# Patient Record
Sex: Male | Born: 1980 | Race: White | Hispanic: No | Marital: Married | Smoking: Former smoker
Health system: Southern US, Community
[De-identification: ages and names within clinical notes are randomized; demographics above are authoritative.]

---

## 2007-04-28 ENCOUNTER — Emergency Department (HOSPITAL_COMMUNITY): Admission: EM | Admit: 2007-04-28 | Discharge: 2007-04-28 | Payer: Self-pay | Admitting: Family Medicine

## 2011-09-03 ENCOUNTER — Emergency Department (HOSPITAL_BASED_OUTPATIENT_CLINIC_OR_DEPARTMENT_OTHER)
Admission: EM | Admit: 2011-09-03 | Discharge: 2011-09-03 | Disposition: A | Discharge status: Home / Self Care | Payer: Self-pay | Attending: Emergency Medicine | Admitting: Emergency Medicine

## 2011-09-03 ENCOUNTER — Encounter (HOSPITAL_BASED_OUTPATIENT_CLINIC_OR_DEPARTMENT_OTHER): Payer: Self-pay | Admitting: *Deleted

## 2011-09-03 ENCOUNTER — Emergency Department (HOSPITAL_BASED_OUTPATIENT_CLINIC_OR_DEPARTMENT_OTHER): Payer: Self-pay

## 2011-09-03 DIAGNOSIS — R269 Unspecified abnormalities of gait and mobility: Secondary | ICD-10-CM | POA: Insufficient documentation

## 2011-09-03 DIAGNOSIS — M25579 Pain in unspecified ankle and joints of unspecified foot: Secondary | ICD-10-CM | POA: Insufficient documentation

## 2011-09-03 DIAGNOSIS — S93402A Sprain of unspecified ligament of left ankle, initial encounter: Secondary | ICD-10-CM

## 2011-09-03 DIAGNOSIS — W11XXXA Fall on and from ladder, initial encounter: Secondary | ICD-10-CM | POA: Insufficient documentation

## 2011-09-03 DIAGNOSIS — F17200 Nicotine dependence, unspecified, uncomplicated: Secondary | ICD-10-CM | POA: Insufficient documentation

## 2011-09-03 DIAGNOSIS — S93409A Sprain of unspecified ligament of unspecified ankle, initial encounter: Secondary | ICD-10-CM | POA: Insufficient documentation

## 2011-09-03 NOTE — ED Provider Notes (Signed)
History     CSN: [id number]  Arrival date & time 09/03/11  1802   First MD Initiated Contact with Patient 09/03/11 1820      Chief Complaint  Patient presents with  . Ankle Pain    (Consider location/radiation/quality/duration/timing/severity/associated sxs/prior treatment) HPI 31 yo male with complaint of ankle pain after fall from ladder this morning.  States that he was stepping down off ladder when he rolled his ankle and fell.  Fall was ~3 feet, did not strike his head. Fall occurred around 10am.  Denies pain elsewhere.  Has noticed bruising and swelling on inside of foot and painful to walk on.   History reviewed. No pertinent past medical history.  History reviewed. No pertinent past surgical history.  History reviewed. No pertinent family history.  History  Substance Use Topics  . Smoking status: Current Everyday Smoker -- 1.0 packs/day  . Smokeless tobacco: Not on file  . Alcohol Use: No      Review of Systems  Musculoskeletal: Positive for gait problem.  Neurological: Negative for dizziness, weakness, numbness and headaches.    Allergies  Review of patient's allergies indicates no known allergies.  Home Medications  No current outpatient prescriptions on file.  BP 129/72  Pulse 88  Temp(Src) 98.6 F (37 C) (Oral)  Resp 16  Ht 5\' 6"  (1.676 m)  Wt 130 lb (58.968 kg)  BMI 20.98 kg/m2  SpO2 100%  Physical Exam  Constitutional: He appears well-nourished. No distress.  Musculoskeletal:       Tenderness to palpation along soft tissue structures of L ankle.    Bruising and swelling present medially. No bony tenderness along medial or lateral malleoli.  Difficulty walking 2/2 to pain.  Sensation intact, able to move toes.     ED Course  Procedures (including critical care time)  Labs Reviewed - No data to display No results found.   No diagnosis found.    MDM  Ankle sprain, negative films of ankle.  Will place in air cast.  Instructions given  for home care.  Patient has crutches at home he will use. 6:51 PM         Everrett Coombe, DO 09/03/11 6606

## 2011-09-03 NOTE — ED Notes (Signed)
Pt c/o left ankle injury after fall off ladder 3 ft

## 2011-09-03 NOTE — Discharge Instructions (Signed)
Use ibuprofen as needed for pain control and swelling.  Ice your ankle 3-4 times per day and keep it elevated over the next couple of days.  You can use work as you can tolerate over the next few days.    Ankle Sprain An ankle sprain is an injury to the strong, fibrous tissues (ligaments) that hold the bones of your ankle joint together.  CAUSES Ankle sprain usually is caused by a fall or by twisting your ankle. People who participate in sports are more prone to these types of injuries.  SYMPTOMS  Symptoms of ankle sprain include:  Pain in your ankle. The pain may be present at rest or only when you are trying to stand or walk.   Swelling.   Bruising. Bruising may develop immediately or within 1 to 2 days after your injury.   Difficulty standing or walking.  DIAGNOSIS  Your caregiver will ask you details about your injury and perform a physical exam of your ankle to determine if you have an ankle sprain. During the physical exam, your caregiver will press and squeeze specific areas of your foot and ankle. Your caregiver will try to move your ankle in certain ways. An X-ray exam may be done to be sure a bone was not broken or a ligament did not separate from one of the bones in your ankle (avulsion).  TREATMENT  Certain types of braces can help stabilize your ankle. Your caregiver can make a recommendation for this. Your caregiver may recommend the use of medication for pain. If your sprain is severe, your caregiver may refer you to a surgeon who helps to restore function to parts of your skeletal system (orthopedist) or a physical therapist. HOME CARE INSTRUCTIONS  Apply ice to your injury for 1 to 2 days or as directed by your caregiver. Applying ice helps to reduce inflammation and pain.  Put ice in a plastic bag.   Place a towel between your skin and the bag.   Leave the ice on for 15 to 20 minutes at a time, every 2 hours while you are awake.   Take over-the-counter or prescription  medicines for pain, discomfort, or fever only as directed by your caregiver.   Keep your injured leg elevated, when possible, to lessen swelling.   If your caregiver recommends crutches, use them as instructed. Gradually, put weight on the affected ankle. Continue to use crutches or a cane until you can walk without feeling pain in your ankle.   If you have a plaster splint, wear the splint as directed by your caregiver. Do not rest it on anything harder than a pillow the first 24 hours. Do not put weight on it. Do not get it wet. You may take it off to take a shower or bath.   You may have been given an elastic bandage to wear around your ankle to provide support. If the elastic bandage is too tight (you have numbness or tingling in your foot or your foot becomes cold and blue), adjust the bandage to make it comfortable.   If you have an air splint, you may blow more air into it or let air out to make it more comfortable. You may take your splint off at night and before taking a shower or bath.   Wiggle your toes in the splint several times per day if you are able.  SEEK MEDICAL CARE IF:   You have an increase in bruising, swelling, or pain.   Your  toes feel cold.   Pain relief is not achieved with medication.  SEEK IMMEDIATE MEDICAL CARE IF: Your toes are numb or blue or you have severe pain. MAKE SURE YOU:   Understand these instructions.   Will watch your condition.   Will get help right away if you are not doing well or get worse.  Document Released: 03/23/2005 Document Revised: 03/12/2011 Document Reviewed: 10/26/2007 [facility] Patient Information 2012 [place], [place].

## 2011-09-03 NOTE — ED Provider Notes (Signed)
I saw and evaluated the patient, reviewed the resident's note and I agree with the findings and plan. Patient with mechanical injury and evidence of ankle sprain with negative films  Gwyneth Sprout, MD 09/03/11 2349

## 2017-07-15 ENCOUNTER — Other Ambulatory Visit: Payer: Self-pay

## 2017-07-15 ENCOUNTER — Emergency Department (HOSPITAL_BASED_OUTPATIENT_CLINIC_OR_DEPARTMENT_OTHER)
Admission: EM | Admit: 2017-07-15 | Discharge: 2017-07-15 | Disposition: A | Discharge status: Home / Self Care | Payer: BLUE CROSS/BLUE SHIELD | Attending: Emergency Medicine | Admitting: Emergency Medicine

## 2017-07-15 ENCOUNTER — Encounter (HOSPITAL_BASED_OUTPATIENT_CLINIC_OR_DEPARTMENT_OTHER): Payer: Self-pay | Admitting: Emergency Medicine

## 2017-07-15 DIAGNOSIS — R109 Unspecified abdominal pain: Secondary | ICD-10-CM | POA: Insufficient documentation

## 2017-07-15 DIAGNOSIS — Z87891 Personal history of nicotine dependence: Secondary | ICD-10-CM | POA: Diagnosis not present

## 2017-07-15 DIAGNOSIS — R3129 Other microscopic hematuria: Secondary | ICD-10-CM | POA: Diagnosis not present

## 2017-07-15 LAB — URINALYSIS, ROUTINE W REFLEX MICROSCOPIC
Bilirubin Urine: NEGATIVE
GLUCOSE, UA: NEGATIVE mg/dL
KETONES UR: NEGATIVE mg/dL
LEUKOCYTES UA: NEGATIVE
Nitrite: NEGATIVE
PROTEIN: NEGATIVE mg/dL
Specific Gravity, Urine: <1.005 — ABNORMAL LOW (ref 1.005–1.030)
pH: 6.5 (ref 5.0–8.0)

## 2017-07-15 LAB — URINALYSIS, MICROSCOPIC (REFLEX)

## 2017-07-15 LAB — BASIC METABOLIC PANEL
ANION GAP: 8 (ref 5–15)
BUN: 14 mg/dL (ref 6–20)
CALCIUM: 9.2 mg/dL (ref 8.9–10.3)
CO2: 24 mmol/L (ref 22–32)
Chloride: 106 mmol/L (ref 101–111)
Creatinine, Ser: 0.74 mg/dL (ref 0.61–1.24)
GFR calc Af Amer: >60 mL/min (ref >60)
GFR calc non Af Amer: >60 mL/min (ref >60)
GLUCOSE: 98 mg/dL (ref 65–99)
Potassium: 4.3 mmol/L (ref 3.5–5.1)
Sodium: 138 mmol/L (ref 135–145)

## 2017-07-15 MED ORDER — NAPROXEN 500 MG PO TABS: 500.0000 mg | ORAL_TABLET | Freq: Two times a day (BID) | ORAL | 0 refills | Status: AC

## 2017-07-15 MED FILL — NAPROXEN 500 MG TABLET: 500 | 10 days supply | Qty: 20 | Fill #0

## 2017-07-15 NOTE — ED Triage Notes (Signed)
Pt sent here by [facility] to r/o kidney stones.   Pt states they stated that he had blood in his urine.

## 2017-07-15 NOTE — Discharge Instructions (Signed)
Please read and follow all provided instructions.  Your diagnoses today include:  1. Flank pain   2. Other microscopic hematuria     Tests performed today include:  Urine test that showed blood in your urine and no infection  Blood test that showed normal kidney function  Vital signs. See below for your results today.   Medications prescribed:   Naproxen - anti-inflammatory pain medication  Do not exceed 500mg  naproxen every 12 hours, take with food  You have been prescribed an anti-inflammatory medication or NSAID. Take with food. Take smallest effective dose for the shortest duration needed for your pain. Stop taking if you experience stomach pain or vomiting.   Take any prescribed medications only as directed.  Home care instructions:  Follow any educational materials contained in this packet.  Please double your fluid intake for the next several days. Strain your urine and save any stones that may pass.   BE VERY CAREFUL not to take multiple medicines containing Tylenol (also called acetaminophen). Doing so can lead to an overdose which can damage your liver and cause liver failure and possibly death.   Follow-up instructions: Please follow-up with your urologist or the urologist referral (provided on front page) in the next 1 week for further evaluation of your symptoms.  Return instructions:   Please return to the Emergency Department if you experience worsening symptoms.  Please return if you develop fever or uncontrolled pain or vomiting.  Please return if you have any other emergent concerns.  Additional Information:  Your vital signs today were: BP 129/83 (BP Location: Left Arm)    Pulse (!) 57    Temp 98 F (36.7 C) (Oral)    Resp 16    Ht 5\' 6"  (1.676 m)    Wt 59 kg (130 lb)    SpO2 100%    BMI 20.98 kg/m  If your blood pressure (BP) was elevated above 135/85 this visit, please have this repeated by your doctor within one month. --------------

## 2017-07-15 NOTE — ED Provider Notes (Signed)
[facility] EMERGENCY DEPARTMENT Provider Note   CSN: [id number] Arrival date & time: 07/15/17  [id number]     History   Chief Complaint Chief Complaint  Patient presents with  . Flank Pain    HPI Nathan Bryan is a 37 y.o. male.  Patient presents the emergency department with acute onset of left-sided flank pain at approximately 4 AM today.  Pain woke patient from sleep.  He states that the pain was severe and he was up in his house pacing.  Pain gradually improved by 7 AM.  It did radiate a bit to the L lower abdomen.  No scrotal or testicular pain. Patient went to an outside urgent care and was found to have hematuria and was sent to the emergency department for evaluation.  Pain is nearly resolved at this time.  No associated fevers, nausea, vomiting, or diarrhea.  No urinary symptoms or hematuria.  Patient has never had a kidney stone before. Aggravating factors: none. Alleviating factors: none.  Patient works outside in the heat.      No past medical history on file.  There are no active problems to display for this patient.   No past surgical history on file.      Home Medications    Prior to Admission medications   Not on File    Family History No family history on file.  Social History Social History   Tobacco Use  . Smoking status: Former Smoker    Packs/day: 1.00  . Smokeless tobacco: Never Used  Substance Use Topics  . Alcohol use: No  . Drug use: No     Allergies   Patient has no known allergies.   Review of Systems Review of Systems  Constitutional: Negative for fever.  HENT: Negative for rhinorrhea and sore throat.   Eyes: Negative for redness.  Respiratory: Negative for cough.   Cardiovascular: Negative for chest pain.  Gastrointestinal: Negative for abdominal pain, diarrhea, nausea and vomiting.  Genitourinary: Positive for flank pain. Negative for dysuria.  Musculoskeletal: Negative for myalgias.  Skin: Negative for rash.   Neurological: Negative for headaches.     Physical Exam Updated Vital Signs BP 129/83 (BP Location: Left Arm)   Pulse (!) 57   Temp 98 F (36.7 C) (Oral)   Resp 16   Ht 5\' 6"  (1.676 m)   Wt 59 kg (130 lb)   SpO2 100%   BMI 20.98 kg/m   Physical Exam  Constitutional: He appears well-developed and well-nourished.  HENT:  Head: Normocephalic and atraumatic.  Eyes: Conjunctivae are normal. Right eye exhibits no discharge. Left eye exhibits no discharge.  Neck: Normal range of motion. Neck supple.  Cardiovascular: Normal rate, regular rhythm and normal heart sounds.  Pulmonary/Chest: Effort normal and breath sounds normal.  Abdominal: Soft. There is no tenderness. There is no rebound and no guarding.  No CVA tenderness.   Neurological: He is alert.  Skin: Skin is warm and dry.  Psychiatric: He has a normal mood and affect.  Nursing note and vitals reviewed.    ED Treatments / Results  Labs (all labs ordered are listed, but only abnormal results are displayed) Labs Reviewed  URINALYSIS, ROUTINE W REFLEX MICROSCOPIC - Abnormal; Notable for the following components:      Result Value   Specific Gravity, Urine <1.005 (*)    Hgb urine dipstick SMALL (*)    All other components within normal limits  URINALYSIS, MICROSCOPIC (REFLEX) - Abnormal; Notable for the following  components:   Bacteria, UA RARE (*)    Squamous Epithelial / LPF 0-5 (*)    All other components within normal limits  BASIC METABOLIC PANEL    EKG None  Radiology No results found.  Procedures Procedures (including critical care time)  Medications Ordered in ED Medications - No data to display   Initial Impression / Assessment and Plan / ED Course  I have reviewed the triage vital signs and the nursing notes.  Pertinent labs & imaging results that were available during my care of the patient were reviewed by me and considered in my medical decision making (see chart for details).      Patient seen and examined. UA with hemoglobin on dipstick. No muscle pain to suspect rhabdomyolysis.  Will ensure kidney function is okay. If pain still controlled, anticipate d/c to home with urine strainer, hydration.    Vital signs reviewed and are as follows: BP 129/83 (BP Location: Left Arm)   Pulse (!) 57   Temp 98 F (36.7 C) (Oral)   Resp 16   Ht 5\' 6"  (1.676 m)   Wt 59 kg (130 lb)   SpO2 100%   BMI 20.98 kg/m   11:53 AM Patient counseled on kidney stone treatment. Urged patient to strain urine and save any stones. Urged urology follow-up and return with any complications. Counseled patient to maintain good fluid intake.    Final Clinical Impressions(s) / ED Diagnoses   Final diagnoses:  Flank pain  Other microscopic hematuria   Patient with symptoms suspicious for left-sided ureteral colic.  Patient had severe symptoms for several hours but then resolved.  He has some mild hematuria.  Normal kidney function.  No UTI.  Patient is currently asymptomatic and has not required any medications here.  Do not feel that patient requires advanced imaging at this time.  Counseled him on what to do of signs and symptoms to return.  ED Discharge Orders        Ordered    naproxen (NAPROSYN) 500 MG tablet  2 times daily     07/15/17 1149       Renne Crigler, PA-C 07/15/17 1154    Arby Barrette, MD 07/16/17 [phone]

## 2017-07-23 ENCOUNTER — Emergency Department (HOSPITAL_BASED_OUTPATIENT_CLINIC_OR_DEPARTMENT_OTHER)
Admission: EM | Admit: 2017-07-23 | Discharge: 2017-07-23 | Disposition: A | Discharge status: Home / Self Care | Payer: BLUE CROSS/BLUE SHIELD | Attending: Emergency Medicine | Admitting: Emergency Medicine

## 2017-07-23 ENCOUNTER — Encounter (HOSPITAL_BASED_OUTPATIENT_CLINIC_OR_DEPARTMENT_OTHER): Payer: Self-pay | Admitting: *Deleted

## 2017-07-23 ENCOUNTER — Emergency Department (HOSPITAL_BASED_OUTPATIENT_CLINIC_OR_DEPARTMENT_OTHER): Payer: BLUE CROSS/BLUE SHIELD

## 2017-07-23 ENCOUNTER — Other Ambulatory Visit: Payer: Self-pay

## 2017-07-23 DIAGNOSIS — Z87891 Personal history of nicotine dependence: Secondary | ICD-10-CM | POA: Insufficient documentation

## 2017-07-23 DIAGNOSIS — M25572 Pain in left ankle and joints of left foot: Secondary | ICD-10-CM | POA: Insufficient documentation

## 2017-07-23 MED ORDER — IBUPROFEN 600 MG PO TABS: 600.0000 mg | ORAL_TABLET | Freq: Four times a day (QID) | ORAL | 0 refills | Status: AC | PRN

## 2017-07-23 NOTE — ED Triage Notes (Signed)
He slid while playing baseball 4 days ago. Bruising and swelling to his ankle and foot.

## 2017-07-23 NOTE — ED Provider Notes (Signed)
[facility] EMERGENCY DEPARTMENT Provider Note   CSN: [id number] Arrival date & time: 07/23/17  1219     History   Chief Complaint Chief Complaint  Patient presents with  . Ankle Injury    HPI Nathan Bryan is a 37 y.o. male who is previously healthy who presents with a 5-day history of left ankle pain after he was playing softball and slid into a base.  He has been walking on his foot all week.  It continues to swell.  He has had some pain, however it is improving.  He has associated bruising.  He has been taking Tylenol at home.  He denies any numbness or tingling.  He denies any other injuries.  Patient reports he is using a brace at home.  HPI  History reviewed. No pertinent past medical history.  There are no active problems to display for this patient.   History reviewed. No pertinent surgical history.      Home Medications    Prior to Admission medications   Medication Sig Start Date End Date Taking? Authorizing Provider  ibuprofen (ADVIL,MOTRIN) 600 MG tablet Take 1 tablet (600 mg total) by mouth every 6 (six) hours as needed. 07/23/17   Derin Matthes, Waylan Boga, PA-C  naproxen (NAPROSYN) 500 MG tablet Take 1 tablet (500 mg total) by mouth 2 (two) times daily. 07/15/17   Renne Crigler, PA-C    Family History No family history on file.  Social History Social History   Tobacco Use  . Smoking status: Former Smoker    Packs/day: 1.00  . Smokeless tobacco: Never Used  Substance Use Topics  . Alcohol use: No  . Drug use: No     Allergies   Patient has no known allergies.   Review of Systems Review of Systems  Musculoskeletal: Positive for arthralgias and joint swelling.  Skin: Positive for color change.  Neurological: Negative for numbness.     Physical Exam Updated Vital Signs BP 123/81   Pulse 77   Temp 98.5 F (36.9 C) (Oral)   Resp 16   Ht 5\' 6"  (1.676 m)   Wt 59 kg (130 lb)   SpO2 100%   BMI 20.98 kg/m   Physical Exam    Constitutional: He appears well-developed and well-nourished. No distress.  HENT:  Head: Normocephalic and atraumatic.  Mouth/Throat: Oropharynx is clear and moist. No oropharyngeal exudate.  Eyes: Pupils are equal, round, and reactive to light. Conjunctivae are normal. Right eye exhibits no discharge. Left eye exhibits no discharge. No scleral icterus.  Neck: Normal range of motion. Neck supple. No thyromegaly present.  Cardiovascular: Normal rate, regular rhythm, normal heart sounds and intact distal pulses. Exam reveals no gallop and no friction rub.  No murmur heard. Pulmonary/Chest: Effort normal and breath sounds normal. No stridor. No respiratory distress. He has no wheezes. He has no rales.  Musculoskeletal: He exhibits no edema.       Left ankle: He exhibits decreased range of motion, swelling and ecchymosis. He exhibits normal pulse. Tenderness. Lateral malleolus tenderness found. No head of 5th metatarsal and no proximal fibula tenderness found. Achilles tendon normal.       Feet:  Patient able to plantar dorsiflex, however inversion eversion is limited, patient states he is unable to evert unrelated to pain  Lymphadenopathy:    He has no cervical adenopathy.  Neurological: He is alert. Coordination normal.  Skin: Skin is warm and dry. No rash noted. He is not diaphoretic. No pallor.  Psychiatric: He has a normal mood and affect.  Nursing note and vitals reviewed.        ED Treatments / Results  Labs (all labs ordered are listed, but only abnormal results are displayed) Labs Reviewed - No data to display  EKG None  Radiology Dg Ankle Complete Left  Result Date: 07/23/2017 CLINICAL DATA:  Slipped while playing baseball a week ago EXAM: LEFT ANKLE COMPLETE - 3+ VIEW COMPARISON:  None. FINDINGS: There is no evidence of fracture, dislocation, or joint effusion. There is no evidence of arthropathy or other focal bone abnormality. There is severe soft tissue swelling  overlying the lateral malleolus. IMPRESSION: No acute osseous injury of the left ankle. Electronically Signed   By: Elige KoHetal  Patel   On: 07/23/2017 12:49   Dg Foot Complete Left  Result Date: 07/23/2017 CLINICAL DATA:  Softball injury.  Bruising. EXAM: LEFT FOOT - COMPLETE 3+ VIEW COMPARISON:  None. FINDINGS: There is no evidence of fracture or dislocation. There is no evidence of arthropathy or other focal bone abnormality. Soft tissues are unremarkable. IMPRESSION: Negative. Electronically Signed   By: Elsie StainJohn T Curnes M.D.   On: 07/23/2017 12:48    Procedures Procedures (including critical care time)  Medications Ordered in ED Medications - No data to display   Initial Impression / Assessment and Plan / ED Course  I have reviewed the triage vital signs and the nursing notes.  Pertinent labs & imaging results that were available during my care of the patient were reviewed by me and considered in my medical decision making (see chart for details).     Patient with suspected severe left ankle sprain.  X-rays of the ankle and foot are negative.  Reviewed by me and radiologist.  Will advise crutches and brace.  Patient reports he has both at home, including lace up ASO brace.  Patient advised to take ibuprofen, elevate, and continue ice.  Will refer to Dr. Pearletha ForgeHudnall with sports medicine.  Return precautions discussed.  Patient understands and agrees with plan.  Patient vitals stable throughout ED course and discharged in satisfactory condition.  Final Clinical Impressions(s) / ED Diagnoses   Final diagnoses:  Acute left ankle pain    ED Discharge Orders        Ordered    ibuprofen (ADVIL,MOTRIN) 600 MG tablet  Every 6 hours PRN     07/23/17 1353       Emi HolesLaw, Maribell Demeo M, PA-C 07/23/17 1359    Tegeler, Canary Brimhristopher J, MD 07/23/17 1546

## 2017-07-23 NOTE — Discharge Instructions (Signed)
Medications: ibuprofen  Treatment: Take ibuprofen every 6 hours.  You can alternate with Tylenol as prescribed over-the-counter.  Use ice 3-4 times daily alternating 20 minutes on, 20 minutes off.  Keep your leg elevated whenever you are not walking on it.  Use crutches at all times and begin partial weightbearing as tolerated.  Follow-up: Please follow-up with Dr. Pearletha ForgeHudnall for further evaluation and treatment of your ankle injury.  Please return to the emergency department if you develop any new or worsening symptoms.

## 2019-10-11 IMAGING — DX DG FOOT COMPLETE 3+V*L*
3 series · 3 of 3 positions shown · non-contrast
Comparison: None.

CLINICAL DATA: Softball injury.  Bruising.

EXAM:
LEFT FOOT - COMPLETE 3+ VIEW

[foot ap]
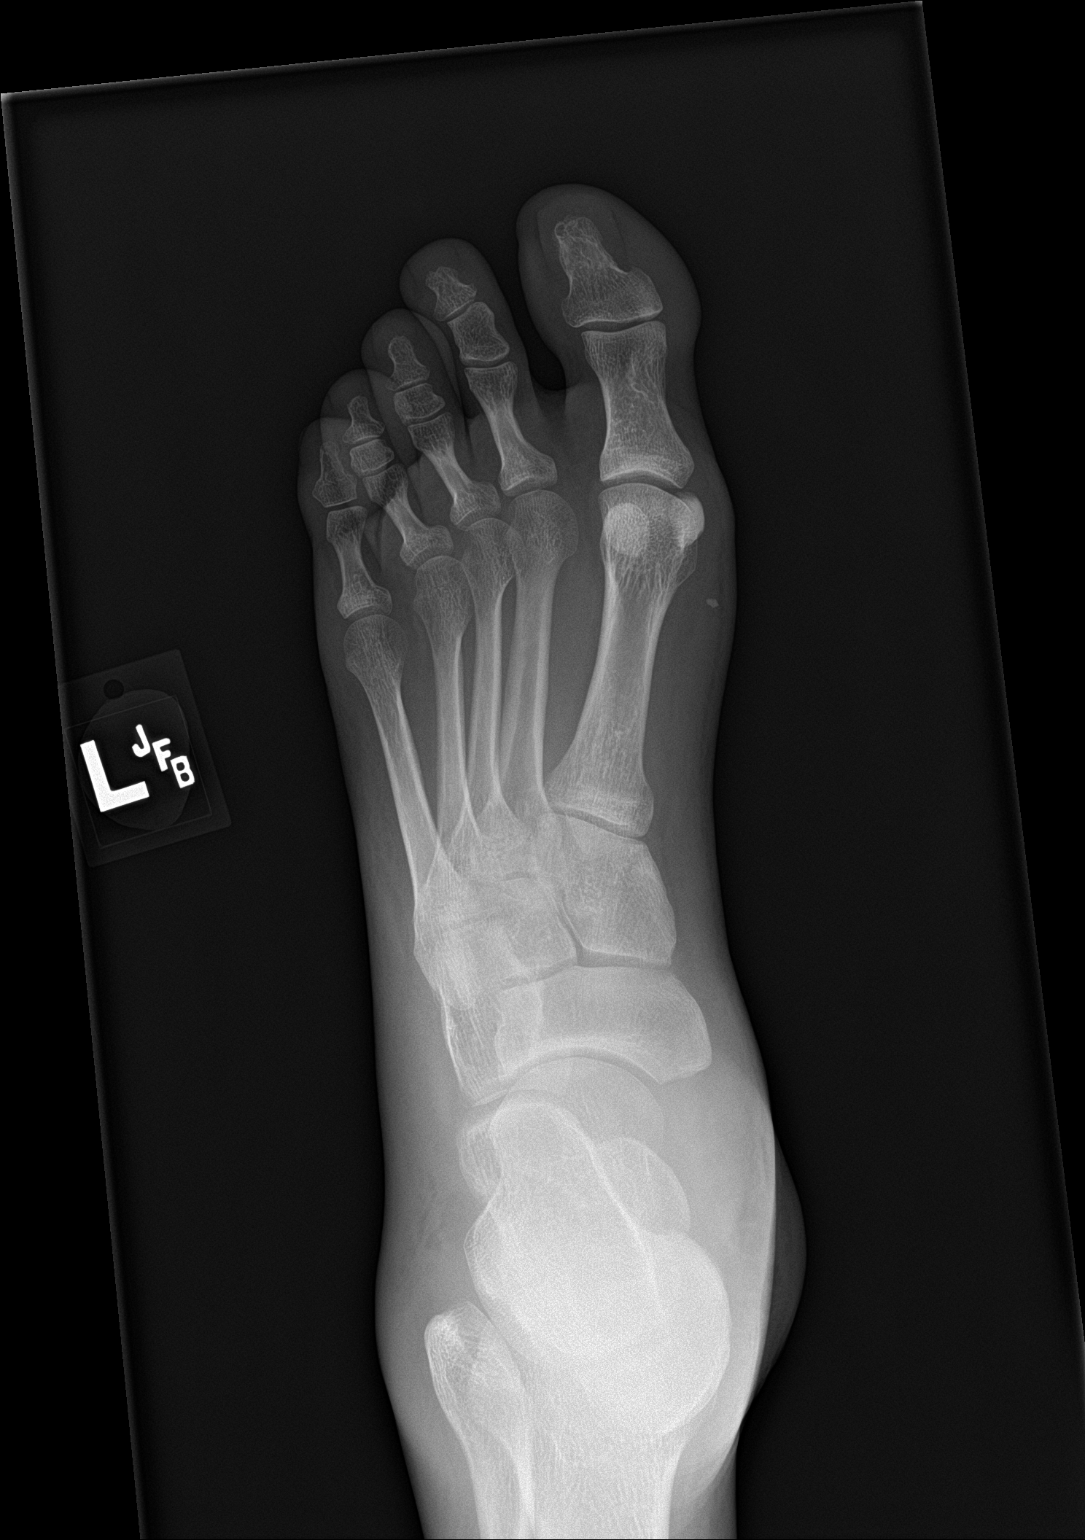

[foot obl]
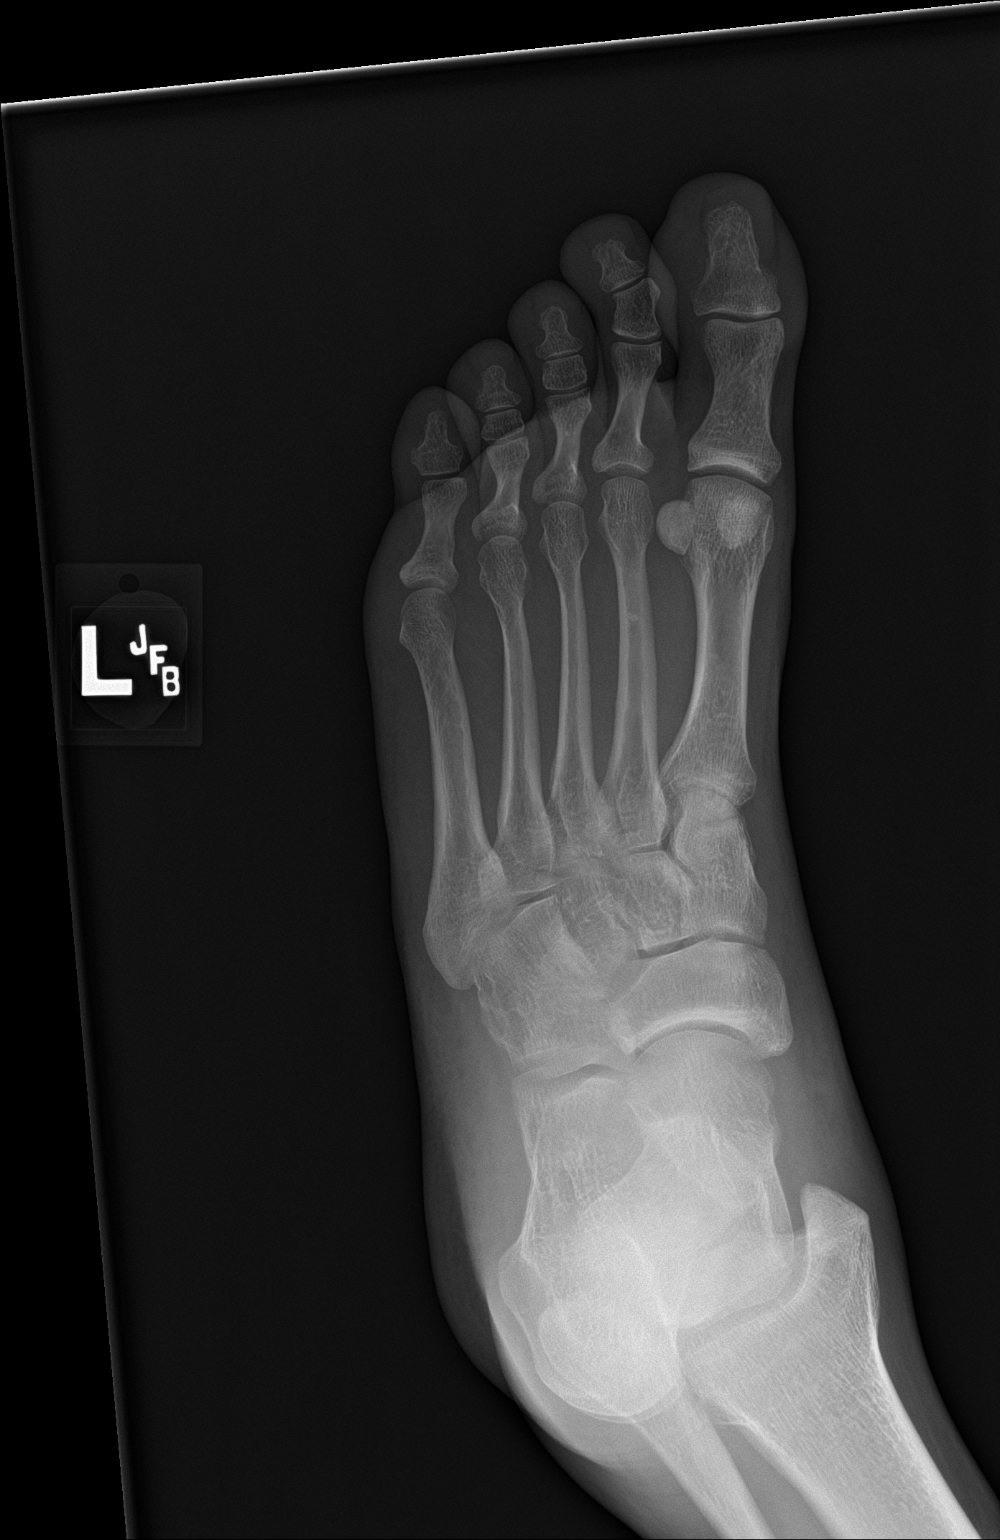

[foot lat]
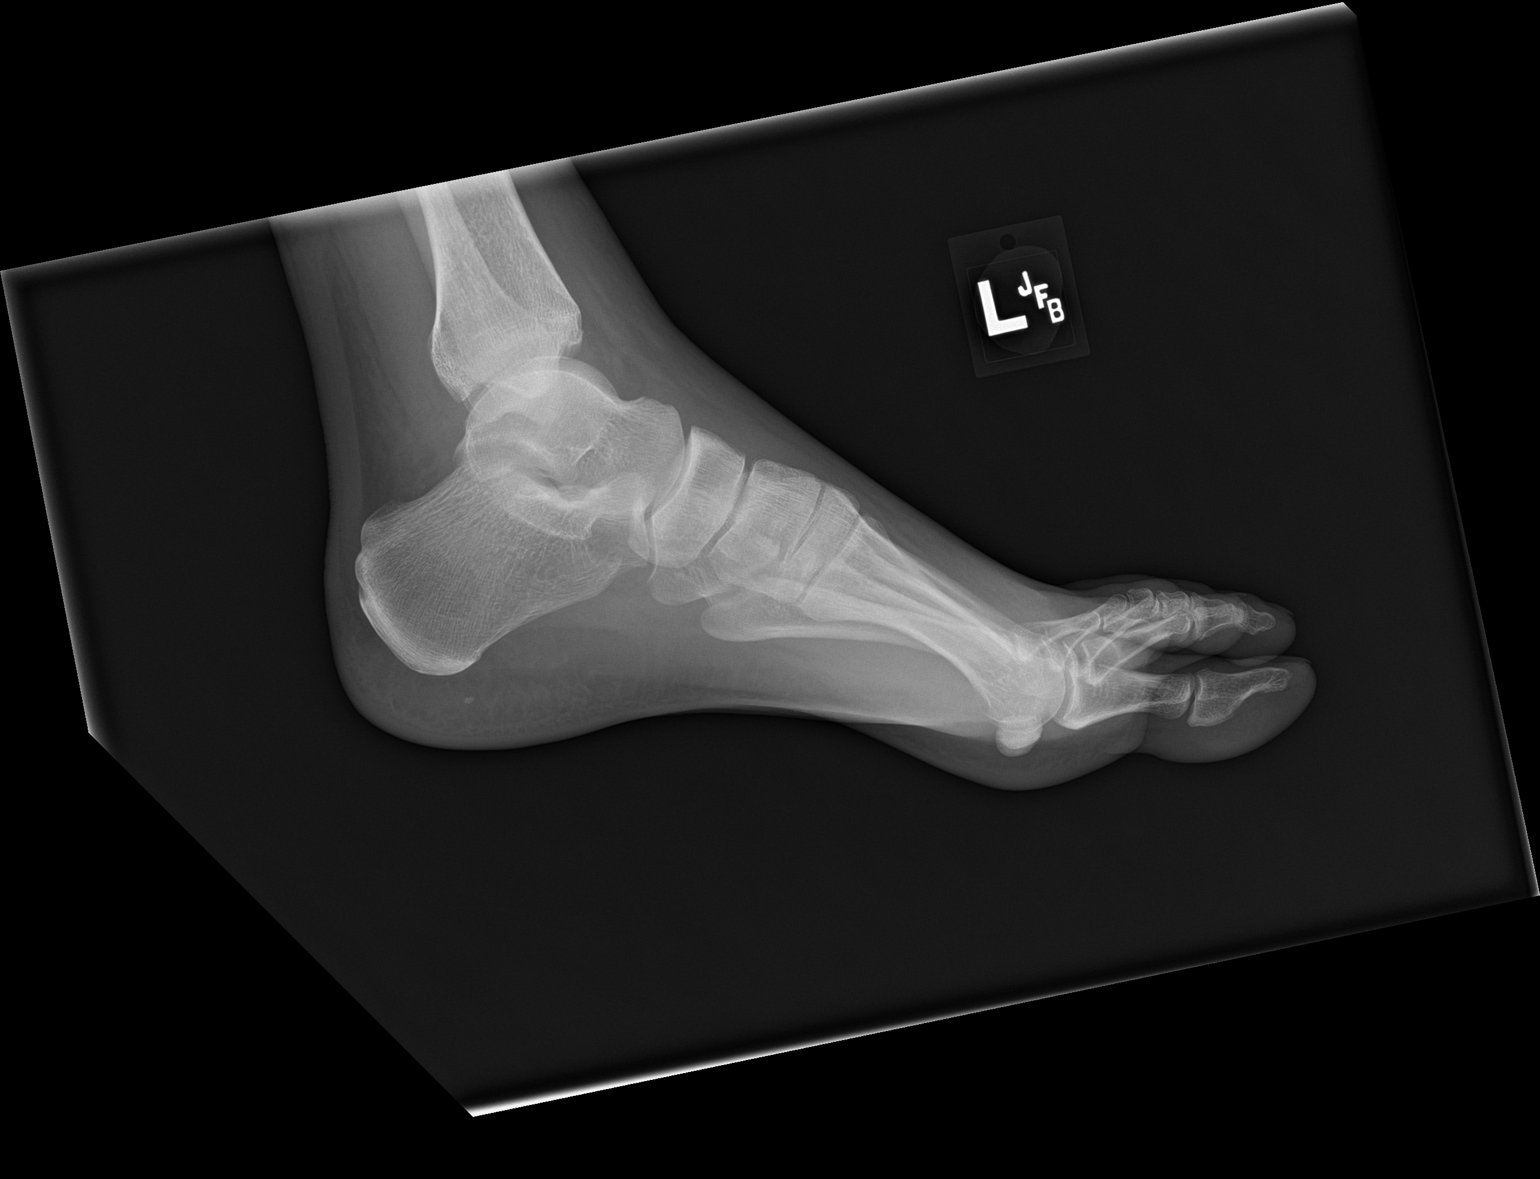

[3 of 3 positions shown; findings below may reference images not displayed]

FINDINGS: There is no evidence of fracture or dislocation. There is no
evidence of arthropathy or other focal bone abnormality. Soft
tissues are unremarkable.
IMPRESSION: Negative.

## 2019-10-11 IMAGING — DX DG ANKLE COMPLETE 3+V*L*
3 series · 3 of 3 positions shown · non-contrast
Comparison: None.

CLINICAL DATA: Slipped while playing baseball a week ago

EXAM:
LEFT ANKLE COMPLETE - 3+ VIEW

[ankle ap]
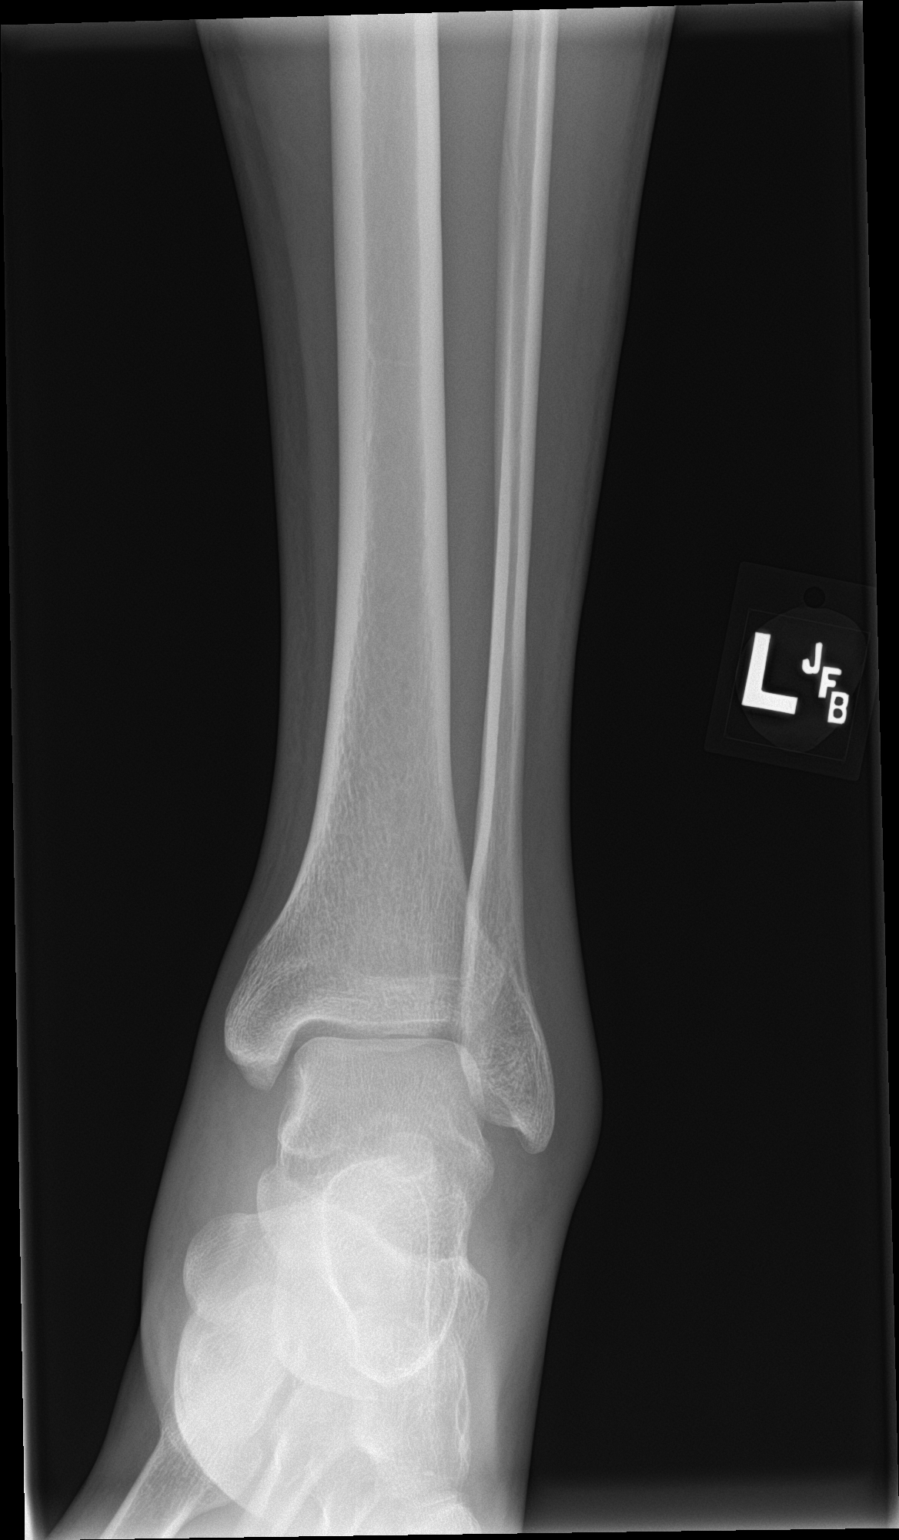

[ankle obl]
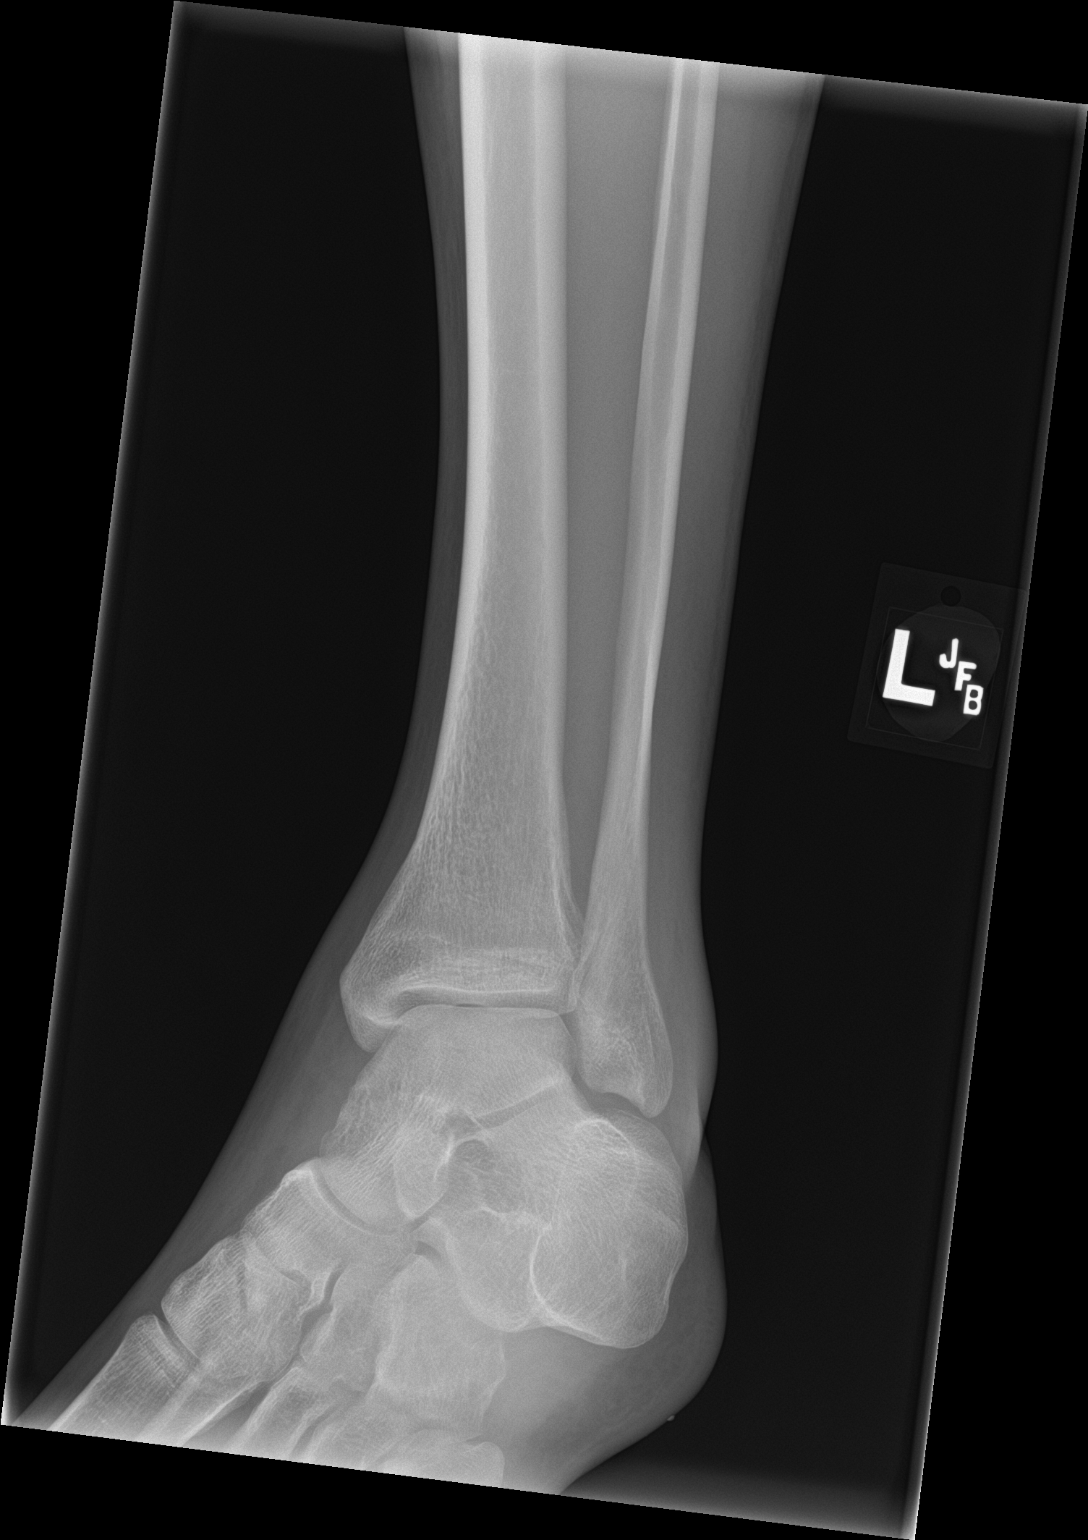

[ankle lat]
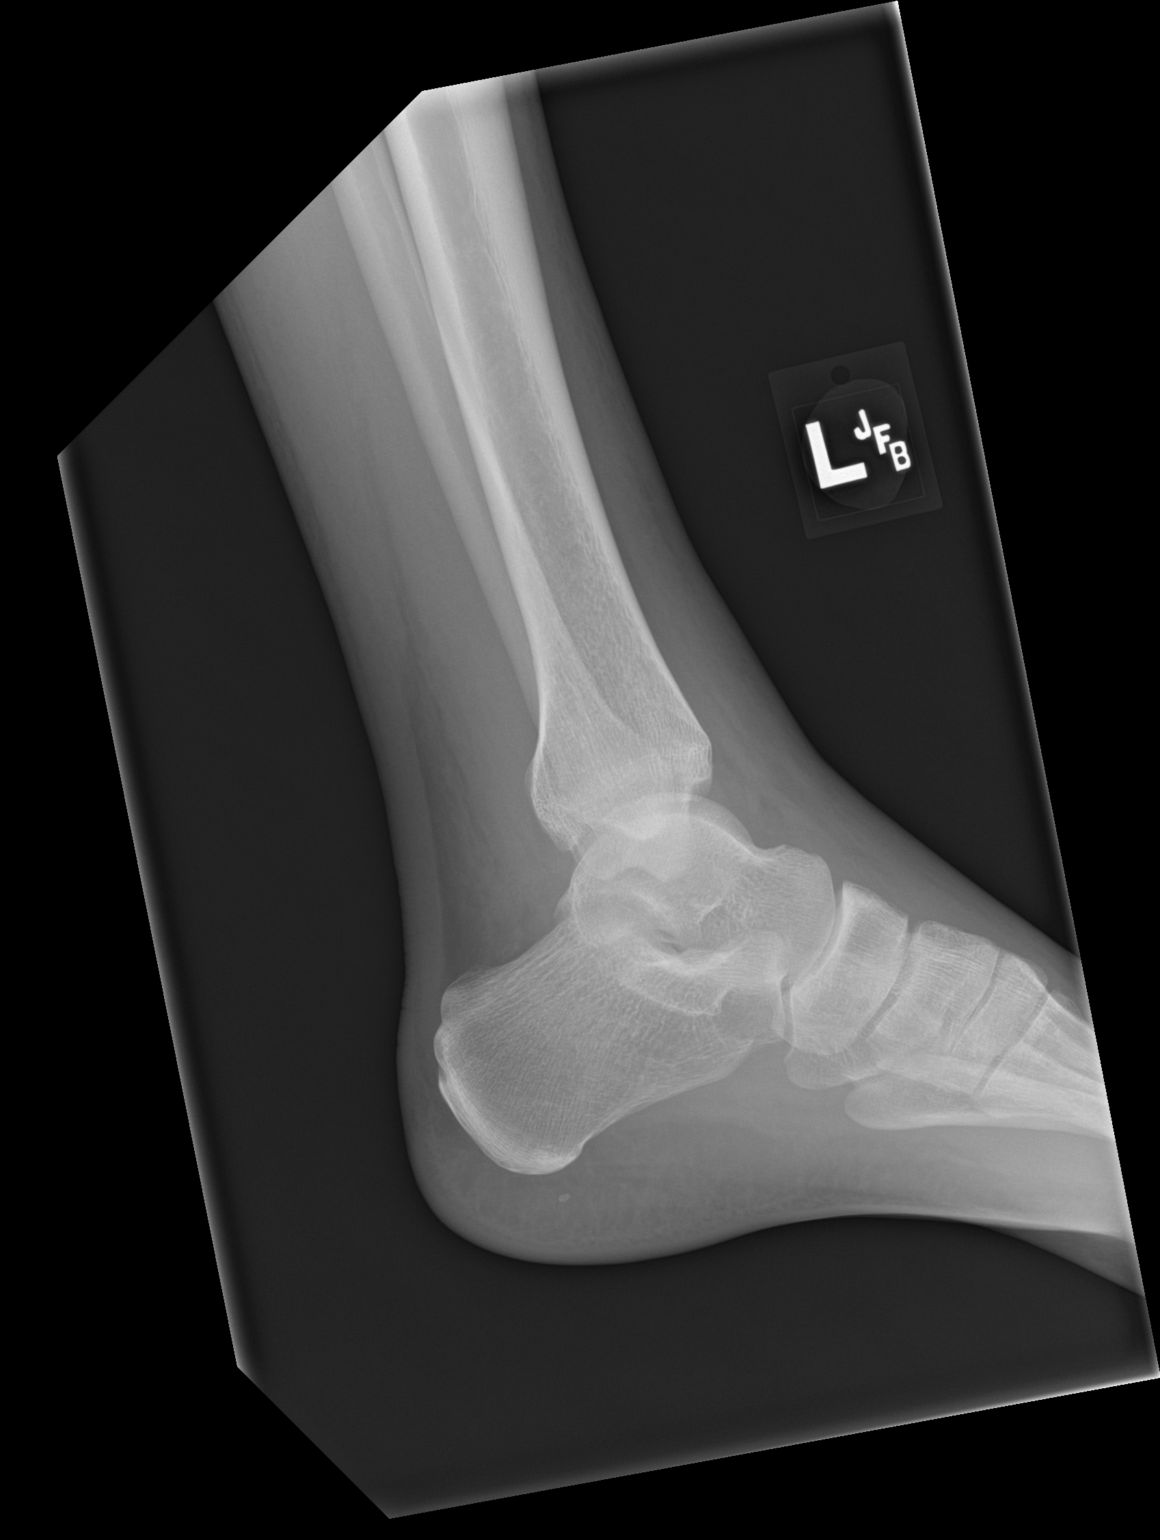

[3 of 3 positions shown; findings below may reference images not displayed]

FINDINGS: There is no evidence of fracture, dislocation, or joint effusion.
There is no evidence of arthropathy or other focal bone abnormality.
There is severe soft tissue swelling overlying the lateral
malleolus.
IMPRESSION: No acute osseous injury of the left ankle.

## 2022-08-17 DIAGNOSIS — Z13220 Encounter for screening for lipoid disorders: Secondary | ICD-10-CM | POA: Diagnosis not present

## 2022-08-17 DIAGNOSIS — Z0000 Encounter for general adult medical examination without abnormal findings: Secondary | ICD-10-CM | POA: Diagnosis not present

## 2022-08-17 DIAGNOSIS — F411 Generalized anxiety disorder: Secondary | ICD-10-CM | POA: Diagnosis not present

## 2022-08-17 DIAGNOSIS — Z23 Encounter for immunization: Secondary | ICD-10-CM | POA: Diagnosis not present

## 2022-09-14 DIAGNOSIS — F411 Generalized anxiety disorder: Secondary | ICD-10-CM | POA: Diagnosis not present

## 2023-11-05 ENCOUNTER — Other Ambulatory Visit: Payer: Self-pay | Admitting: Family Medicine

## 2023-11-05 DIAGNOSIS — R1010 Upper abdominal pain, unspecified: Secondary | ICD-10-CM

## 2023-11-16 ENCOUNTER — Other Ambulatory Visit

## 2023-11-25 ENCOUNTER — Ambulatory Visit
Admission: RE | Admit: 2023-11-25 | Discharge: 2023-11-25 | Disposition: A | Discharge status: Home / Self Care | Source: Ambulatory Visit | Attending: Family Medicine | Admitting: Family Medicine

## 2023-11-25 DIAGNOSIS — R1010 Upper abdominal pain, unspecified: Secondary | ICD-10-CM
